# Patient Record
Sex: Female | Born: 1975 | Race: Black or African American | Hispanic: No | Marital: Single | Smoking: Current some day smoker
Health system: Southern US, Community
[De-identification: ages and names within clinical notes are randomized; demographics above are authoritative.]

## PROBLEM LIST (undated history)

## (undated) HISTORY — PX: WISDOM TOOTH EXTRACTION: SHX21

---

## 2002-04-09 ENCOUNTER — Emergency Department (HOSPITAL_COMMUNITY): Admission: EM | Admit: 2002-04-09 | Discharge: 2002-04-09 | Payer: Self-pay | Admitting: Emergency Medicine

## 2002-04-09 ENCOUNTER — Encounter: Payer: Self-pay | Admitting: Emergency Medicine

## 2015-08-19 ENCOUNTER — Emergency Department (HOSPITAL_COMMUNITY)
Admission: EM | Admit: 2015-08-19 | Discharge: 2015-08-19 | Disposition: A | Discharge status: Home / Self Care | Payer: Self-pay | Attending: Emergency Medicine | Admitting: Emergency Medicine

## 2015-08-19 ENCOUNTER — Encounter (HOSPITAL_COMMUNITY): Payer: Self-pay | Admitting: Emergency Medicine

## 2015-08-19 DIAGNOSIS — L03115 Cellulitis of right lower limb: Secondary | ICD-10-CM | POA: Insufficient documentation

## 2015-08-19 DIAGNOSIS — F17200 Nicotine dependence, unspecified, uncomplicated: Secondary | ICD-10-CM | POA: Insufficient documentation

## 2015-08-19 MED ORDER — SULFAMETHOXAZOLE-TRIMETHOPRIM 800-160 MG PO TABS: 1.0000 | ORAL_TABLET | Freq: Two times a day (BID) | ORAL | Status: AC

## 2015-08-19 MED ORDER — MUPIROCIN CALCIUM 2 % NA OINT: TOPICAL_OINTMENT | NASAL | Status: AC

## 2015-08-19 MED ORDER — CLINDAMYCIN HCL 150 MG PO CAPS
450.0000 mg | ORAL_CAPSULE | Freq: Three times a day (TID) | ORAL | Status: DC
Start: 2015-08-19 — End: 2015-08-19

## 2015-08-19 MED ORDER — CEPHALEXIN 500 MG PO CAPS: 500.0000 mg | ORAL_CAPSULE | Freq: Four times a day (QID) | ORAL | Status: AC

## 2015-08-19 MED ORDER — ONDANSETRON 8 MG PO TBDP: ORAL_TABLET | ORAL | Status: AC

## 2015-08-19 NOTE — Discharge Instructions (Signed)
1. Medications: clindamycin, bactroban, usual home medications 2. Treatment: rest, drink plenty of fluids, use warm compresses, flush with warm water several times per day and wash with warm soap and water 3. Follow Up: Please followup with your primary doctor in 2-3 days for discussion of your diagnoses and further evaluation after today's visit; if you do not have a primary care doctor use the resource guide provided to find one; Please return to the ER for fevers, chills, nausea, vomiting or other signs of infection    Cellulitis Cellulitis is an infection of the skin and the tissue beneath it. The infected area is usually red and tender. Cellulitis occurs most often in the arms and lower legs.  CAUSES  Cellulitis is caused by bacteria that enter the skin through cracks or cuts in the skin. The most common types of bacteria that cause cellulitis are staphylococci and streptococci. SIGNS AND SYMPTOMS   Redness and warmth.  Swelling.  Tenderness or pain.  Fever. DIAGNOSIS  Your health care provider can usually determine what is wrong based on a physical exam. Blood tests may also be done. TREATMENT  Treatment usually involves taking an antibiotic medicine. HOME CARE INSTRUCTIONS   Take your antibiotic medicine as directed by your health care provider. Finish the antibiotic even if you start to feel better.  Keep the infected arm or leg elevated to reduce swelling.  Apply a warm cloth to the affected area up to 4 times per day to relieve pain.  Take medicines only as directed by your health care provider.  Keep all follow-up visits as directed by your health care provider. SEEK MEDICAL CARE IF:   You notice red streaks coming from the infected area.  Your red area gets larger or turns dark in color.  Your bone or joint underneath the infected area becomes painful after the skin has healed.  Your infection returns in the same area or another area.  You notice a swollen bump  in the infected area.  You develop new symptoms.  You have a fever. SEEK IMMEDIATE MEDICAL CARE IF:   You feel very sleepy.  You develop vomiting or diarrhea.  You have a general ill feeling (malaise) with muscle aches and pains.   This information is not intended to replace advice given to you by your health care provider. Make sure you discuss any questions you have with your health care provider.   Document Released: 05/08/2005 Document Revised: 04/19/2015 Document Reviewed: 10/14/2011 Elsevier Interactive Patient Education 2016 [employer].    Emergency Department Resource Guide 1) Find a Doctor and Pay Out of Pocket Although you won't have to find out who is covered by your insurance plan, it is a good idea to ask around and get recommendations. You will then need to call the office and see if the doctor you have chosen will accept you as a new patient and what types of options they offer for patients who are self-pay. Some doctors offer discounts or will set up payment plans for their patients who do not have insurance, but you will need to ask so you aren't surprised when you get to your appointment.  2) Contact Your Local Health Department Not all health departments have doctors that can see patients for sick visits, but many do, so it is worth a call to see if yours does. If you don't know where your local health department is, you can check in your phone book. The CDC also has a tool to  help you locate your state's health department, and many state websites also have listings of all of their local health departments.  3) Find a Walk-in Clinic If your illness is not likely to be very severe or complicated, you may want to try a walk in clinic. These are popping up all over the country in pharmacies, drugstores, and shopping centers. They're usually staffed by nurse practitioners or physician assistants that have been trained to treat common illnesses and complaints. They're  usually fairly quick and inexpensive. However, if you have serious medical issues or chronic medical problems, these are probably not your best option.  No Primary Care Doctor: - Call Health Connect at  [phone] - they can help you locate a primary care doctor that  accepts your insurance, provides certain services, etc. - Physician Referral Service- [phone]  Chronic Pain Problems: Organization         Address  Phone   Notes  [facility]  [phone] Patients need to be referred by their primary care doctor.   Medication Assistance: Organization         Address  Phone   Notes  [facility] Medication [facility] [address] [place], [place] [address] [phone] --Must be a resident of [facility] -- Must have NO insurance coverage whatsoever (no Medicaid/ Medicare, etc.) -- The pt. MUST have a primary care doctor that directs their care regularly and follows them in the community   [facility]  [phone]   [employer]  [phone]    Agencies that provide inexpensive medical care: Organization         Address  Phone   Notes  [facility]  [phone]   [facility]    [phone]   [facility] [address] [place], [place] [address] [phone]   [facility] [address] [place] [phone]   Planned Parenthood    [phone]   [facility]    [phone]   [facility]  [address], [place] Phone:  [phone], Fax:  [phone] Hours of Operation:  9 am - 6 pm, M-F.  Also accepts Medicaid/Medicare and self-pay.  [facility]  [address], [place] Phone: [phone], Fax: [phone]. Hours of Operation:  8:30 am - 5:30 pm, M-F.  Also accepts Medicaid and self-pay.  [facility] [place] [address], [place] Phone:  [phone]   [facility] [address] Natasha Bence [place], [place] [phone], Ext. [phone] Mondays & Thursdays: 7-9 AM.  First 15 patients are seen on a first come, first serve basis.    Medicaid-accepting [facility] Providers:  Organization         Address  Phone   Notes  [facility] [address],  [phone] Also accepts self-pay patients.  [facility] [address] Laurell Josephs [place], [place]  [phone]   [facility] [address], [place] [phone]   [facility] [address], [place] [phone]   Renaye Rakers [address], [place]   [phone] Only accepts [place] patients after they have their name applied to their card.   Self-Pay (no insurance) in [facility]:  Organization  Address  Phone   Notes  Sickle Cell Patients, [facility] [address], [place] [phone]) [phone]   [facility] [address], [place] [phone]   [facility]  [address],  [phone]   [facility]/Dr. Osei-Bonsu  [address] [place] [address], [place] or [phone], [place] [phone]) [phone] Phone number for both [place] and [place] locations is the same.  [facility] [address], [place] [phone]   [facility] [address] [place] [address], [place] or [address] [phone]) [phone]   [facility] [address], [place] [phone], phone; [phone], fax Sees patients 1st and 3rd Saturday of every month.  Must not qualify for public or private insurance (i.e. Medicaid, Medicare, [place] Health Choice, Veterans' Benefits)  Household income should be no more than 200% of the poverty level The clinic cannot treat  you if you are pregnant or think you are pregnant  Sexually transmitted diseases are not treated at the clinic.    Dental Care: Organization         Address  Phone  Notes  [facility] [address], [place] [phone] Accepts children up to age 40 who are enrolled in [place]icaid or [place] Health Choice; pregnant women with a Medicaid card; and children who have applied for Medicaid or [place] Health Choice, but were declined, whose parents can pay a reduced fee at time of service.  [facility] [place]  [address], [place] [phone] Accepts children up to age 40 who are enrolled in [place]icaid or [place] Health Choice; pregnant women with a Medicaid card; and children who have applied for Medicaid or [place] Health Choice, but were declined, whose parents can pay a reduced fee at time of service.  [facility]  [address], [place] [phone]) [phone] Patients are seen by appointment only. Walk-ins are not accepted. [facility] will see patients 40 years of age and older. Monday - Tuesday (8am-5pm) Most Wednesdays (8:30-5pm) $30 per visit, cash only  [facility]  [address], [facility] [phone]) [phone] Patients are seen by appointment only. Walk-ins are not accepted. [facility] will see patients 40 years of age and older. One Wednesday Evening (Monthly: Volunteer Based).  $30 per visit, cash only  [employer] [facility]  [phone] for adults; Children under age 324, call Graduate Pediatric Dentistry at [phone]. Children aged 894-14, please call [phone] to request a pediatric application.  Dental services are provided in all areas of dental care including fillings, crowns and bridges, complete and partial dentures, implants, gum treatment, root canals, and extractions. Preventive care is also provided. Treatment  is provided to both adults and children. Patients are selected via a lottery and there is often a waiting list.   [facility] [address], [place]  [phone]) [phone] [email or web address]   [facility] [address], [place], [place] [phone], Ext. [phone] Second and Fourth Thursday of each month, opens at 6:30 AM; Clinic ends at 9 AM.  Patients are seen on a first-come first-served basis, and a limited number are seen during each clinic.   [facility]  [address], [place], [place] [phone]   Eligibility Requirements You must have lived in [place], [place], or [place] counties for at least the last three months.   You cannot be eligible for state or federal sponsored National Cityhealthcare insurance, including CIGNAVeterans Administration, [place]icaid, or [employer] EntertainmentMedicare.   You generally cannot be eligible for healthcare insurance through your employer.    How to apply: Eligibility screenings are held every Tuesday and Wednesday afternoon from 1:00 pm until 4:00 pm. You do not need an appointment for the interview!  [facility] [address], [place], [place] [phone]   [facility]  [phone]   [facility]  [phone]   [facility]  [phone]    Behavioral Health Resources in the Community: Intensive Outpatient Programs Organization         Address  Phone  Notes  [facility] [address], [place], [place] [phone]   [facility] Outpatient [address], [place], [place] [phone]   [facility] [address], [place], [place]  [phone]   [facility] [address],  [place], [place] [phone] or [phone]   Substance Abuse Resources Organization         Address  Phone  Notes  [facility]  [phone]   [facility]  [phone]   [facility]  [phone]   [facility]  [phone]   Residential & Outpatient Substance Abuse Program  [phone]   Psychological Services Organization         Address  Phone  Notes  [facility]  [phone]- [phone]   [facility]  [phone]- [phone]   [facility] [address], [place] [phone] or [phone]    Mobile Crisis Teams Organization         Address  Phone  Notes  [facility], Mobile Crisis Care Unit  [phone]   [facility]  [address] [place], [place] [phone]   Doristine LocksSharon DeEsch [address] [place] [phone]    Self-Help/Support Groups Organization         Address  Phone             Notes  [facility] [place] - variety of support groups  [phone]- [phone] Call for more information  Narcotics Anonymous (NA), [facility] [address], [place]  2 meetings at this location   Statisticianesidential Treatment Programs Organization         Address  Phone  Notes  [facility] [address],    [place]  [phone]   [facility]  [address], [place] [phone], [place], [place] [phone]   [facility] [address], [place] [phone] Admissions: 8am-3pm M-F  [facility] [address]    [place], [place] [phone]   [facility] [address], [place], [place] [phone]   [facility] [address]  [place], [place] [phone]   [facility] - Intensive Outpatient [address] Laure[address] Josephs[address], [place], [place] [phone]   [facility] ([facility] [address] [place] [address]  [place], [place] [phone] or [phone]   [facility] ([facility]) [address] [place], [place] [phone] Accepts Medicaid  [facility] [address]  [place] [phone] Substance Abuse/Addiction Treatment     [facility] Resources  Organization         Address  Phone  Notes  °[facility]  [phone]) [phone]   °Julie Brannon, PhD [address] [place], [employer]   ([phone] or ([phone]) [phone]   °[facility]   [address] °[place], [place] ([phone]) [phone]   °[facility] [address], [place], [place] [phone]) [phone] Insurance/Medicaid/sponsorship through [facility]  °[facility] [address]                                    [place], [place] [phone] Therapy/tele-psych/case  °[facility] [address]  ° [place], [place] [phone]) [phone]    °Dr. Arfeen  ([phone]   °[facility] [place]  [employer] [place] [facility] 1) [address],  °2) [address], [place] °3)  [address] [place] [address], [place] [phone]) [phone] °([phone]) [phone] ° °([phone]) [phone]   °[place] Child Abuse Hotline (336) [phone] or ([phone]) [phone] (After Hours)    ° ° ° °

## 2015-08-19 NOTE — ED Provider Notes (Signed)
CSN: [id number]     Arrival date & time 08/19/15  1637 History   First MD Initiated Contact with Patient 08/19/15 1818     Chief Complaint  Patient presents with  . Abscess     (Consider location/radiation/quality/duration/timing/severity/associated sxs/prior Treatment) Patient is a 40 y.o. female presenting with abscess. The history is provided by the patient and medical records. No language interpreter was used.  Abscess Associated symptoms: no fatigue, no fever, no headaches, no nausea and no vomiting      Barbara Cohen is a 40 y.o. female  with no known medical hx presents to the Emergency Department complaining of gradual, persistent, progressively worsening lesion to the right anterior tibia onset 2 weeks ago. Pt reports she has used alcohol swabs and a white potato with initial resolution, but persistence of other lesions.  She now has several lesions on the right leg.  Pt has also been using peroxide on the lesions as well.  Pt reports similar lesion on her boyfriend.  She is most concerned about a now fluctuant lesion on the right anterior leg. No known aggravating or alleviating factors.  Pt denies fever, chills, headache, neck pain, chest pain, SOB, abd pain, N/V/D, weakness, dizziness, syncope, dysuria.  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  She is not a diabetic or immunocompromised.     History reviewed. No pertinent past medical history. History reviewed. No pertinent past surgical history. No family history on file. Social History  Substance Use Topics  . Smoking status: Current Every Day Smoker  . Smokeless tobacco: None  . Alcohol Use: No   OB History    No data available     Review of Systems  Constitutional: Negative for fever, diaphoresis, appetite change, fatigue and unexpected weight change.  HENT: Negative for mouth sores.   Eyes: Negative for visual disturbance.  Respiratory: Negative for cough, chest tightness, shortness of breath and wheezing.    Cardiovascular: Negative for chest pain.  Gastrointestinal: Negative for nausea, vomiting, abdominal pain, diarrhea and constipation.  Endocrine: Negative for polydipsia, polyphagia and polyuria.  Genitourinary: Negative for dysuria, urgency, frequency and hematuria.  Musculoskeletal: Negative for back pain and neck stiffness.  Skin: Positive for color change and wound. Negative for rash.  Allergic/Immunologic: Negative for immunocompromised state.  Neurological: Negative for syncope, light-headedness and headaches.  Hematological: Does not bruise/bleed easily.  Psychiatric/Behavioral: Negative for sleep disturbance. The patient is not nervous/anxious.       Allergies  Review of patient's allergies indicates not on file.  Home Medications   Prior to Admission medications   Medication Sig Start Date End Date Taking? Authorizing Provider  cephALEXin (KEFLEX) 500 MG capsule Take 1 capsule (500 mg total) by mouth 4 (four) times daily. 08/19/15   Evie Crumpler, PA-C  mupirocin nasal ointment (BACTROBAN) 2 % Apply to open wound and abscess daily 08/19/15   Dahlia Client Charle Clear, PA-C  ondansetron (ZOFRAN ODT) 8 MG disintegrating tablet 8mg  ODT q4-6 hours prn nausea 08/19/15   Dahlia Client Davinia Riccardi, PA-C  sulfamethoxazole-trimethoprim (BACTRIM DS,SEPTRA DS) 800-160 MG tablet Take 1 tablet by mouth 2 (two) times daily. 08/19/15 08/26/15  Danelia Snodgrass, PA-C   BP 120/69 mmHg  Pulse 116  Temp(Src) 98.1 F (36.7 C) (Oral)  Resp 16  SpO2 100%  LMP 07/31/2015 Physical Exam  Constitutional: She is oriented to person, place, and time. She appears well-developed and well-nourished. No distress.  Awake, alert, nontoxic appearance  HENT:  Head: Normocephalic and atraumatic.  Mouth/Throat: Oropharynx  is clear and moist. No oropharyngeal exudate.  Eyes: Conjunctivae are normal. No scleral icterus.  Neck: Normal range of motion. Neck supple.  Cardiovascular: Normal rate, regular rhythm and  intact distal pulses.   2+ DP pulses in the right foot Capillary refill < 3 sec  Pulmonary/Chest: Effort normal and breath sounds normal. No respiratory distress. She has no wheezes.  Equal chest expansion  Abdominal: Soft. Bowel sounds are normal. She exhibits no distension and no mass. There is no tenderness. There is no rebound and no guarding.  Musculoskeletal: Normal range of motion. She exhibits no edema.  Lymphadenopathy:    She has no cervical adenopathy.  Neurological: She is alert and oriented to person, place, and time.  Speech is clear and goal oriented Moves extremities without ataxia  Skin: Skin is warm and dry. She is not diaphoretic. There is erythema.  One 3 cm round open wound to the mid shaft tibia anteriorly with granulation tissue visible at the base of the wound Large 5 cm round area of erythema and induration just proximal to the open wound without fluctuance. Several small pustules noted along the anterior lower leg on the right  Psychiatric: She has a normal mood and affect.  Nursing note and vitals reviewed.   ED Course  Procedures (including critical care time)  EMERGENCY DEPARTMENT US SOFT TISSUE INTERPRETATION "Study: Limited Ultrasound of the noted body part in comments below"  INDICATIONS: Pain and Soft tissue infection Multiple views of the body part are obtained with a multi-frequency linear probe  PERFORMED BY:  Myself  IMAGES ARCHIVED?: Yes  SIDE:Right   BODY PART: leg  FINDINGS: No abcess noted and Cellulitis present  LIMITATIONS:  Body Habitus  INTERPRETATION:  No abcess noted and Cellulitis present  COMMENT:  No fluid collection for which I&D would be beneficial   MDM   Final diagnoses:  Cellulitis of right leg without foot   Barbara Cohen presents with cellulitis of the right lower leg.  Patient health care experience makes this likely MRSA. Nonhealing of the initial wound is likely secondary to persistent peroxide usage.  Discussed wound care. Will give keflex, bactrim and Bactroban.  Patient is to follow-up with her primary care provider within 2 days for wound check.  Patient denies history of IV drug use, HIV, diabetes, cocaine abuse, vasculitis.  Strong pulses in her right foot with good capillary refill.  No other reasons for delayed wound healing.  Discussed reasons to return to the emergency department.  BP 115/68 mmHg  Pulse 73  Temp(Src) 98.2 F (36.8 C) (Oral)  Resp 18  SpO2 100%  LMP 07/31/2015   Dahlia ClientHannah Nyshaun Standage, PA-C 08/19/15 2008  Lorre NickAnthony Allen, MD 08/23/15 [phone]

## 2015-08-19 NOTE — ED Notes (Signed)
Per pt, states abscess on right shine for 2 weeks

## 2017-07-22 ENCOUNTER — Other Ambulatory Visit: Payer: Self-pay

## 2017-07-22 DIAGNOSIS — N390 Urinary tract infection, site not specified: Secondary | ICD-10-CM | POA: Insufficient documentation

## 2017-07-22 DIAGNOSIS — R1030 Lower abdominal pain, unspecified: Secondary | ICD-10-CM | POA: Diagnosis present

## 2017-07-22 DIAGNOSIS — F17210 Nicotine dependence, cigarettes, uncomplicated: Secondary | ICD-10-CM | POA: Diagnosis not present

## 2017-07-22 DIAGNOSIS — Z79899 Other long term (current) drug therapy: Secondary | ICD-10-CM | POA: Diagnosis not present

## 2017-07-23 ENCOUNTER — Encounter (HOSPITAL_COMMUNITY): Payer: Self-pay | Admitting: Emergency Medicine

## 2017-07-23 ENCOUNTER — Emergency Department (HOSPITAL_COMMUNITY)
Admission: EM | Admit: 2017-07-23 | Discharge: 2017-07-23 | Disposition: A | Discharge status: Home / Self Care | Payer: Commercial Managed Care - PPO | Attending: Emergency Medicine | Admitting: Emergency Medicine

## 2017-07-23 DIAGNOSIS — N390 Urinary tract infection, site not specified: Secondary | ICD-10-CM

## 2017-07-23 LAB — BASIC METABOLIC PANEL
Anion gap: 6 (ref 5–15)
BUN: 14 mg/dL (ref 6–20)
CALCIUM: 9.3 mg/dL (ref 8.9–10.3)
CHLORIDE: 106 mmol/L (ref 101–111)
CO2: 26 mmol/L (ref 22–32)
CREATININE: 0.72 mg/dL (ref 0.44–1.00)
GFR calc Af Amer: >60 mL/min (ref >60)
GFR calc non Af Amer: >60 mL/min (ref >60)
Glucose, Bld: 99 mg/dL (ref 65–99)
Potassium: 3.5 mmol/L (ref 3.5–5.1)
SODIUM: 138 mmol/L (ref 135–145)

## 2017-07-23 LAB — CBC
HCT: 38.4 % (ref 36.0–46.0)
Hemoglobin: 12.9 g/dL (ref 12.0–15.0)
MCH: 32.2 pg (ref 26.0–34.0)
MCHC: 33.6 g/dL (ref 30.0–36.0)
MCV: 95.8 fL (ref 78.0–100.0)
PLATELETS: 228 10*3/uL (ref 150–400)
RBC: 4.01 MIL/uL (ref 3.87–5.11)
RDW: 12.8 % (ref 11.5–15.5)
WBC: 5.1 10*3/uL (ref 4.0–10.5)

## 2017-07-23 LAB — I-STAT BETA HCG BLOOD, ED (MC, WL, AP ONLY)

## 2017-07-23 LAB — URINALYSIS, ROUTINE W REFLEX MICROSCOPIC
Bilirubin Urine: NEGATIVE
GLUCOSE, UA: NEGATIVE mg/dL
Hgb urine dipstick: NEGATIVE
KETONES UR: NEGATIVE mg/dL
Leukocytes, UA: NEGATIVE
NITRITE: POSITIVE — AB
PROTEIN: NEGATIVE mg/dL
Specific Gravity, Urine: 1.027 (ref 1.005–1.030)
pH: 5 (ref 5.0–8.0)

## 2017-07-23 MED ORDER — PHENAZOPYRIDINE HCL 200 MG PO TABS: 200.0000 mg | ORAL_TABLET | Freq: Three times a day (TID) | ORAL | 0 refills | Status: AC | PRN

## 2017-07-23 MED ORDER — NITROFURANTOIN MONOHYD MACRO 100 MG PO CAPS: 100.0000 mg | ORAL_CAPSULE | Freq: Two times a day (BID) | ORAL | 0 refills | Status: AC

## 2017-07-23 MED ORDER — PHENAZOPYRIDINE HCL 100 MG PO TABS
200.0000 mg | ORAL_TABLET | Freq: Once | ORAL | Status: AC
  Administered 2017-07-23: 200 mg via ORAL
  Filled 2017-07-23: qty 2

## 2017-07-23 MED ORDER — OXYCODONE-ACETAMINOPHEN 5-325 MG PO TABS
1.0000 | ORAL_TABLET | ORAL | Status: DC | PRN
Start: 2017-07-23 — End: 2017-07-23
  Administered 2017-07-23: 1 via ORAL
  Filled 2017-07-23: qty 1

## 2017-07-23 MED ORDER — NITROFURANTOIN MONOHYD MACRO 100 MG PO CAPS
100.0000 mg | ORAL_CAPSULE | Freq: Once | ORAL | Status: AC
  Administered 2017-07-23: 100 mg via ORAL
  Filled 2017-07-23: qty 1

## 2017-07-23 NOTE — ED Triage Notes (Signed)
Reports sudden onset of right flank pain this evening.  Denies having any urinary symptoms.  Hx of bulging disc.  Took ibuprofen 800mg  at 2300 with no relief.

## 2017-07-23 NOTE — ED Provider Notes (Signed)
[facility]   CSN: [id number] Arrival date & time: 07/22/17  2339     History   Chief Complaint Chief Complaint  Patient presents with  . Flank Pain    HPI Barbara BooksKiki Cohen is a 41 y.o. female.   Back Pain   This is a recurrent problem. The current episode started 3 to 5 hours ago. The problem has not changed since onset.The pain is associated with no known injury. The pain is present in the sacro-iliac joint. The quality of the pain is described as stabbing. The pain does not radiate. The pain is moderate. The pain is the same all the time. Associated symptoms include dysuria. Pertinent negatives include no chest pain, no fever, no numbness, no weight loss, no headaches, no abdominal pain, no abdominal swelling, no bowel incontinence, no perianal numbness, no bladder incontinence, no pelvic pain, no leg pain, no paresthesias, no paresis, no tingling and no weakness. She has tried nothing for the symptoms. The treatment provided no relief.    History reviewed. No pertinent past medical history.  There are no active problems to display for this patient.   Past Surgical History:  Procedure Laterality Date  . CESAREAN SECTION     X 2    OB History    No data available       Home Medications    Prior to Admission medications   Medication Sig Start Date End Date Taking? Authorizing Provider  cephALEXin (KEFLEX) 500 MG capsule Take 1 capsule (500 mg total) by mouth 4 (four) times daily. 08/19/15   Muthersbaugh, Dahlia ClientHannah, PA-C  mupirocin nasal ointment (BACTROBAN) 2 % Apply to open wound and abscess daily 08/19/15   Muthersbaugh, Dahlia ClientHannah, PA-C  ondansetron (ZOFRAN ODT) 8 MG disintegrating tablet 8mg  ODT q4-6 hours prn nausea 08/19/15   Muthersbaugh, Dahlia ClientHannah, PA-C    Family History No family history on file.  Social History Social History   Tobacco Use  . Smoking status: Current Every Day Smoker  . Smokeless tobacco: Never Used    Substance Use Topics  . Alcohol use: No  . Drug use: Not on file     Allergies   Bactrim [sulfamethoxazole-trimethoprim]   Review of Systems Review of Systems  Constitutional: Negative for fever and weight loss.  Cardiovascular: Negative for chest pain.  Gastrointestinal: Negative for abdominal pain, bowel incontinence and nausea.  Genitourinary: Positive for dysuria. Negative for bladder incontinence, flank pain, hematuria and pelvic pain.  Musculoskeletal: Positive for back pain.  Neurological: Negative for tingling, weakness, numbness, headaches and paresthesias.  All other systems reviewed and are negative.    Physical Exam Updated Vital Signs BP (!) 110/44 (BP Location: Right Arm)   Pulse 64   Temp 98.1 F (36.7 C) (Oral)   Resp 16   Ht 4\' 11"  (1.499 m)   Wt 66.7 kg (147 lb)   LMP 07/02/2017 (Approximate)   SpO2 100%   BMI 29.69 kg/m   Physical Exam  Constitutional: She is oriented to person, place, and time. She appears well-developed and well-nourished.  HENT:  Head: Normocephalic and atraumatic.  Mouth/Throat: No oropharyngeal exudate.  Eyes: Conjunctivae are normal. Pupils are equal, round, and reactive to light.  Neck: Normal range of motion. Neck supple.  Cardiovascular: Normal rate, regular rhythm, normal heart sounds and intact distal pulses.  Pulmonary/Chest: Effort normal and breath sounds normal. No stridor. She has no wheezes. She has no rales.  Abdominal: Soft. Bowel sounds are normal. She  exhibits no mass. There is no tenderness. There is no rebound and no guarding.  Musculoskeletal: Normal range of motion. She exhibits no edema.  Neurological: She is alert and oriented to person, place, and time. She displays normal reflexes.  Skin: Skin is warm and dry. Capillary refill takes less than 2 seconds.  Psychiatric: She has a normal mood and affect.  Nursing note and vitals reviewed.    ED Treatments / Results  Labs (all labs ordered are  listed, but only abnormal results are displayed)  Results for orders placed or performed during the hospital encounter of 07/23/17  Urinalysis, Routine w reflex microscopic- may I&O cath if menses  Result Value Ref Range   Color, Urine AMBER (A) YELLOW   APPearance HAZY (A) CLEAR   Specific Gravity, Urine 1.027 1.005 - 1.030   pH 5.0 5.0 - 8.0   Glucose, UA NEGATIVE NEGATIVE mg/dL   Hgb urine dipstick NEGATIVE NEGATIVE   Bilirubin Urine NEGATIVE NEGATIVE   Ketones, ur NEGATIVE NEGATIVE mg/dL   Protein, ur NEGATIVE NEGATIVE mg/dL   Nitrite POSITIVE (A) NEGATIVE   Leukocytes, UA NEGATIVE NEGATIVE   RBC / HPF 0-5 0 - 5 RBC/hpf   WBC, UA 0-5 0 - 5 WBC/hpf   Bacteria, UA MANY (A) NONE SEEN   Squamous Epithelial / LPF 0-5 (A) NONE SEEN   Mucus PRESENT    Ca Oxalate Crys, UA PRESENT   Basic metabolic panel  Result Value Ref Range   Sodium 138 135 - 145 mmol/L   Potassium 3.5 3.5 - 5.1 mmol/L   Chloride 106 101 - 111 mmol/L   CO2 26 22 - 32 mmol/L   Glucose, Bld 99 65 - 99 mg/dL   BUN 14 6 - 20 mg/dL   Creatinine, Ser 1.61 0.44 - 1.00 mg/dL   Calcium 9.3 8.9 - 09.6 mg/dL   GFR calc non Af Amer >60 >60 mL/min   GFR calc Af Amer >60 >60 mL/min   Anion gap 6 5 - 15  CBC  Result Value Ref Range   WBC 5.1 4.0 - 10.5 K/uL   RBC 4.01 3.87 - 5.11 MIL/uL   Hemoglobin 12.9 12.0 - 15.0 g/dL   HCT 04.5 40.9 - 81.1 %   MCV 95.8 78.0 - 100.0 fL   MCH 32.2 26.0 - 34.0 pg   MCHC 33.6 30.0 - 36.0 g/dL   RDW 91.4 78.2 - 95.6 %   Platelets 228 150 - 400 K/uL  I-Stat beta hCG blood, ED  Result Value Ref Range   I-stat hCG, quantitative <5.0 <5 mIU/mL   Comment 3           No results found.   Procedures Procedures (including critical care time)  Medications Ordered in ED Medications  oxyCODONE-acetaminophen (PERCOCET/ROXICET) 5-325 MG per tablet 1 tablet (1 tablet Oral Given 07/23/17 0015)  phenazopyridine (PYRIDIUM) tablet 200 mg (not administered)  nitrofurantoin  (macrocrystal-monohydrate) (MACROBID) capsule 100 mg (not administered)      Final Clinical Impressions(s) / ED Diagnoses   UTI: denies discharge or new partners.  States this is consistent with past UTI. Follow up with your PMD in 2 days for recheck, return for diarrhea intractable pain, inability to pass stool, rigid abdomen, rectal bleeding fevers > 101, intractable vomiting.  Strict return precautions for facial swelling change in thinking or speech, fevers, stiff neck, intractable vomiting, or diarrhea, abdominal pain, Inability to tolerate liquids or food, cough, altered mental status or any concerns. No signs of  systemic illness or infection. The patient is nontoxic-appearing on exam and vital signs are within normal limits.    I have reviewed the triage vital signs and the nursing notes. Pertinent labs &imaging results that were available during my care of the patient were reviewed by me and considered in my medical decision making (see chart for details).  After history, exam, and medical workup I feel the patient has been appropriately medically screened and is safe for discharge home. Pertinent diagnoses were discussed with the patient. Patient was given return precautions      Alahni Varone, MD 07/23/17 [id number]

## 2018-10-31 ENCOUNTER — Encounter (HOSPITAL_COMMUNITY): Payer: Self-pay | Admitting: Emergency Medicine

## 2018-10-31 ENCOUNTER — Emergency Department (HOSPITAL_COMMUNITY): Payer: Self-pay

## 2018-10-31 ENCOUNTER — Other Ambulatory Visit: Payer: Self-pay

## 2018-10-31 ENCOUNTER — Emergency Department (HOSPITAL_COMMUNITY)
Admission: EM | Admit: 2018-10-31 | Discharge: 2018-10-31 | Disposition: A | Discharge status: Home / Self Care | Payer: Self-pay | Attending: Emergency Medicine | Admitting: Emergency Medicine

## 2018-10-31 DIAGNOSIS — R69 Illness, unspecified: Secondary | ICD-10-CM

## 2018-10-31 DIAGNOSIS — F17290 Nicotine dependence, other tobacco product, uncomplicated: Secondary | ICD-10-CM | POA: Insufficient documentation

## 2018-10-31 DIAGNOSIS — R059 Cough, unspecified: Secondary | ICD-10-CM

## 2018-10-31 DIAGNOSIS — J111 Influenza due to unidentified influenza virus with other respiratory manifestations: Secondary | ICD-10-CM | POA: Insufficient documentation

## 2018-10-31 DIAGNOSIS — Z79899 Other long term (current) drug therapy: Secondary | ICD-10-CM | POA: Insufficient documentation

## 2018-10-31 DIAGNOSIS — R05 Cough: Secondary | ICD-10-CM

## 2018-10-31 MED ORDER — ONDANSETRON 4 MG PO TBDP: 4.0000 mg | ORAL_TABLET | Freq: Three times a day (TID) | ORAL | 0 refills | Status: AC | PRN

## 2018-10-31 MED ORDER — OSELTAMIVIR PHOSPHATE 75 MG PO CAPS: 75.0000 mg | ORAL_CAPSULE | Freq: Two times a day (BID) | ORAL | 0 refills | Status: AC

## 2018-10-31 NOTE — Discharge Instructions (Signed)
Continue to stay well-hydrated. Gargle warm salt water and spit it out and use chloraseptic spray as needed for sore throat. Continue to alternate between Tylenol and Ibuprofen for pain or fever. Use Mucinex/Robitussin/etc for cough suppression/expectoration of mucus. Use over the counter flonase and the netipot to help with nasal congestion. May consider over-the-counter Benadryl or other antihistamine like Claritin/Zyrtec/etc to decrease secretions and for help with your symptoms. Take tamiflu as directed which may help decrease the severity and duration of symptoms if your illness is due to the flu. TAMIFLU DOES NOT CURE THE FLU! Take on a full stomach as it can cause some nausea. Use zofran as directed as needed for nausea, take 30 minutes before taking tamiflu in order to lessen the nausea from tamiflu.   Follow up with your primary care doctor in 5-7 days for recheck of ongoing symptoms. Return to emergency department for emergent changing or worsening of symptoms.

## 2018-10-31 NOTE — ED Provider Notes (Signed)
[facility]-EMERGENCY DEPT Provider Note   CSN: [id number] Arrival date & time: 10/31/18  1036    History   Chief Complaint Chief Complaint  Patient presents with  . Cough  . Headache  . Generalized Body Aches    HPI    Barbara Cohen is a 43 y.o. otherwise healthy female who presents to the ED with complaints of flulike symptoms that began yesterday.  Someone in the home recently had similar illness, and she presents today with her 2 children who also have a similar illness.  Symptoms include moderate constant body aches, headaches, cough with yellow sputum production, chills, and rhinorrhea.  She has tried Tylenol and cold medicine without relief.  She has not taken any of this today.  No known aggravating factors.  No recent travel.  She denies any known fevers, sore throat, ear pain or drainage, CP, SOB, abd pain, N/V/D/C, hematuria, dysuria, numbness, tingling, focal weakness, or any other complaints at this time.   The history is provided by the patient and medical records. No language interpreter was used.  Cough  Associated symptoms: chills, headaches, myalgias and rhinorrhea   Associated symptoms: no chest pain, no ear pain, no fever, no shortness of breath and no sore throat   Headache  Associated symptoms: cough and myalgias   Associated symptoms: no abdominal pain, no diarrhea, no ear pain, no fever, no nausea, no numbness, no sore throat, no vomiting and no weakness     History reviewed. No pertinent past medical history.  There are no active problems to display for this patient.   Past Surgical History:  Procedure Laterality Date  . CESAREAN SECTION     X 2  . WISDOM TOOTH EXTRACTION       OB History   No obstetric history on file.      Home Medications    Prior to Admission medications   Medication Sig Start Date End Date Taking? Authorizing Provider  cephALEXin (KEFLEX) 500 MG capsule Take 1 capsule (500 mg total) by mouth 4 (four)  times daily. 08/19/15   Muthersbaugh, Dahlia Client, PA-C  mupirocin nasal ointment (BACTROBAN) 2 % Apply to open wound and abscess daily 08/19/15   Muthersbaugh, Dahlia Client, PA-C  nitrofurantoin, macrocrystal-monohydrate, (MACROBID) 100 MG capsule Take 1 capsule (100 mg total) by mouth 2 (two) times daily. X 7 days 07/23/17   Nicanor Alcon, April, MD  ondansetron [facility] ODT) 8 MG disintegrating tablet 8mg  ODT q4-6 hours prn nausea 08/19/15   Muthersbaugh, Dahlia Client, PA-C  phenazopyridine (PYRIDIUM) 200 MG tablet Take 1 tablet (200 mg total) by mouth 3 (three) times daily as needed for pain. 07/23/17   Palumbo, April, MD    Family History Family History  Problem Relation Age of Onset  . Heart failure Mother   . Diabetes Father     Social History Social History   Tobacco Use  . Smoking status: Current Some Day Smoker    Types: Cigars  . Smokeless tobacco: Never Used  Substance Use Topics  . Alcohol use: Yes    Comment: occ  . Drug use: Not on file     Allergies   Bactrim [sulfamethoxazole-trimethoprim]   Review of Systems Review of Systems  Constitutional: Positive for chills. Negative for fever.  HENT: Positive for rhinorrhea. Negative for ear discharge, ear pain and sore throat.   Respiratory: Positive for cough. Negative for shortness of breath.   Cardiovascular: Negative for chest pain.  Gastrointestinal: Negative for abdominal pain, constipation, diarrhea, nausea and  vomiting.  Genitourinary: Negative for dysuria and hematuria.  Musculoskeletal: Positive for myalgias. Negative for arthralgias.  Skin: Negative for color change.  Allergic/Immunologic: Negative for immunocompromised state.  Neurological: Positive for headaches. Negative for weakness and numbness.  Psychiatric/Behavioral: Negative for confusion.   All other systems reviewed and are negative for acute change except as noted in the HPI.    Physical Exam Updated Vital Signs BP 105/65 (BP Location: Left Arm)   Pulse 84   Temp  99.3 F (37.4 C) (Oral)   Resp 18   Wt 65.8 kg   LMP 09/27/2018 (Exact Date)   SpO2 100%   BMI 29.29 kg/m   Physical Exam Vitals signs and nursing note reviewed.  Constitutional:      General: She is not in acute distress.    Appearance: Normal appearance. She is well-developed. She is not toxic-appearing.     Comments: Afebrile, nontoxic, NAD  HENT:     Head: Normocephalic and atraumatic.     Nose: Rhinorrhea present.     Mouth/Throat:     Mouth: Mucous membranes are moist.     Pharynx: Oropharynx is clear. Uvula midline. No pharyngeal swelling, oropharyngeal exudate, posterior oropharyngeal erythema or uvula swelling.     Tonsils: No tonsillar exudate or tonsillar abscesses.     Comments: Nose with mild rhinorrhea. Oropharynx clear and moist, without uvular swelling or deviation, no trismus or drooling, no tonsillar swelling or erythema, no exudates.   Eyes:     General:        Right eye: No discharge.        Left eye: No discharge.     Conjunctiva/sclera: Conjunctivae normal.  Neck:     Musculoskeletal: Normal range of motion and neck supple.  Cardiovascular:     Rate and Rhythm: Normal rate and regular rhythm.     Pulses: Normal pulses.     Heart sounds: Normal heart sounds, S1 normal and S2 normal. No murmur. No friction rub. No gallop.   Pulmonary:     Effort: Pulmonary effort is normal. No respiratory distress.     Breath sounds: Normal breath sounds. No decreased breath sounds, wheezing, rhonchi or rales.     Comments: CTAB in all lung fields, no w/r/r, no hypoxia or increased WOB, speaking in full sentences, SpO2 100% on RA  Abdominal:     General: Bowel sounds are normal. There is no distension.     Palpations: Abdomen is soft. Abdomen is not rigid.     Tenderness: There is no abdominal tenderness. There is no right CVA tenderness, left CVA tenderness, guarding or rebound. Negative signs include Murphy's sign and McBurney's sign.  Musculoskeletal: Normal range of  motion.  Skin:    General: Skin is warm and dry.     Findings: No rash.  Neurological:     Mental Status: She is alert and oriented to person, place, and time.     Sensory: Sensation is intact. No sensory deficit.     Motor: Motor function is intact.  Psychiatric:        Mood and Affect: Mood and affect normal.        Behavior: Behavior normal.      ED Treatments / Results  Labs (all labs ordered are listed, but only abnormal results are displayed) Labs Reviewed - No data to display  EKG None  Radiology Dg Chest 2 View  Result Date: 10/31/2018 CLINICAL DATA:  Dry cough, headache, sweating, chills and aches since yesterday. EXAM:  CHEST - 2 VIEW COMPARISON:  None. FINDINGS: Lungs clear. Heart size normal. No pneumothorax or pleural fluid. No bony abnormality. IMPRESSION: Negative chest Electronically Signed   By: Drusilla Kanner M.D.   On: 10/31/2018 12:14    Procedures Procedures (including critical care time)  Medications Ordered in ED Medications - No data to display   Initial Impression / Assessment and Plan / ED Course  I have reviewed the triage vital signs and the nursing notes.  Pertinent labs & imaging results that were available during my care of the patient were reviewed by me and considered in my medical decision making (see chart for details).        43 y.o. female here with flulike symptoms since yesterday, positive sick contacts at home, came in today with her 2 children who are also sick with similar symptoms.  On exam, afebrile, nontoxic-appearing, clear lung exam, mild rhinorrhea, throat clear.  No recent travel. Overall well appearing, doubt need for COVID 19 testing.  Will check chest x-ray.  Her child is getting flu tested, so we will defer flu testing on her.  Doubt need for any other labs or imaging at this time.  Will reassess shortly.  12:41 PM CXR neg. Child's flu test positive for Flu A. Will treat accordingly, zofran rx given to counter side  effects of tamiflu. Advised other OTC remedies for symptomatic relief. F/up with PCP in 1wk for recheck. I explained the diagnosis and have given explicit precautions to return to the ER including for any other new or worsening symptoms. The patient understands and accepts the medical plan as it's been dictated and I have answered their questions. Discharge instructions concerning home care and prescriptions have been given. The patient is STABLE and is discharged to home in good condition.    Final Clinical Impressions(s) / ED Diagnoses   Final diagnoses:  Influenza-like illness  Cough    ED Discharge Orders         Ordered    oseltamivir (TAMIFLU) 75 MG capsule  Every 12 hours     10/31/18 1241    ondansetron (ZOFRAN ODT) 4 MG disintegrating tablet  Every 8 hours PRN     10/31/18 [address] [place], [place] 10/31/18 1254    Vanetta Mulders, MD 11/04/18 2053

## 2018-10-31 NOTE — ED Triage Notes (Signed)
Pt reports dry cough, headache and generalized muscle aches since yesterday. Tx with tylenol 1 Gm x 2 dosages yesterday. Denies NVD. Denies shortness of breathg

## 2020-08-18 IMAGING — CR CHEST - 2 VIEW
2 series · 2 of 2 positions shown · non-contrast
Comparison: None.

CLINICAL DATA: Dry cough, headache, sweating, chills and aches
since yesterday.

EXAM:
CHEST - 2 VIEW

[w chest pa]
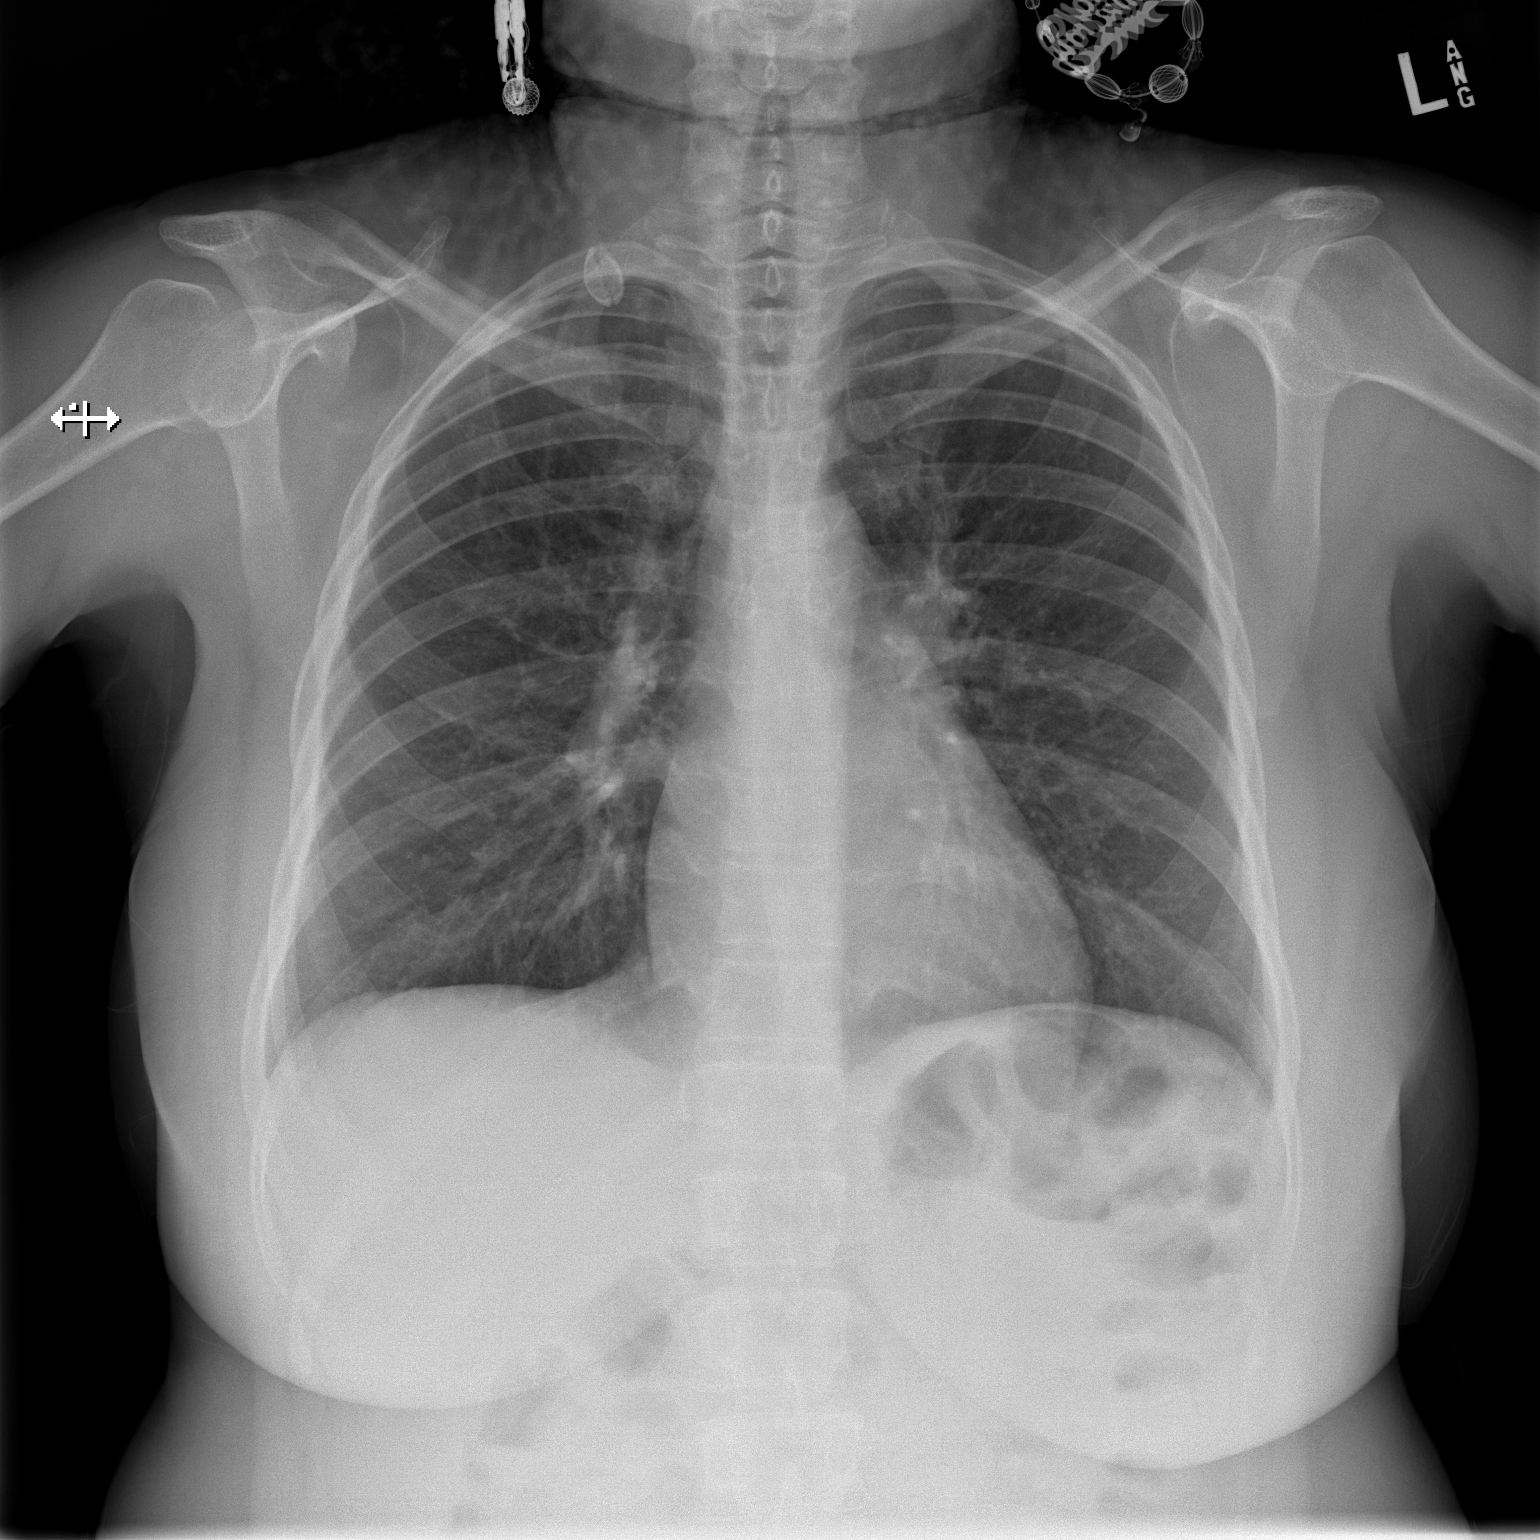

[w chest lat]
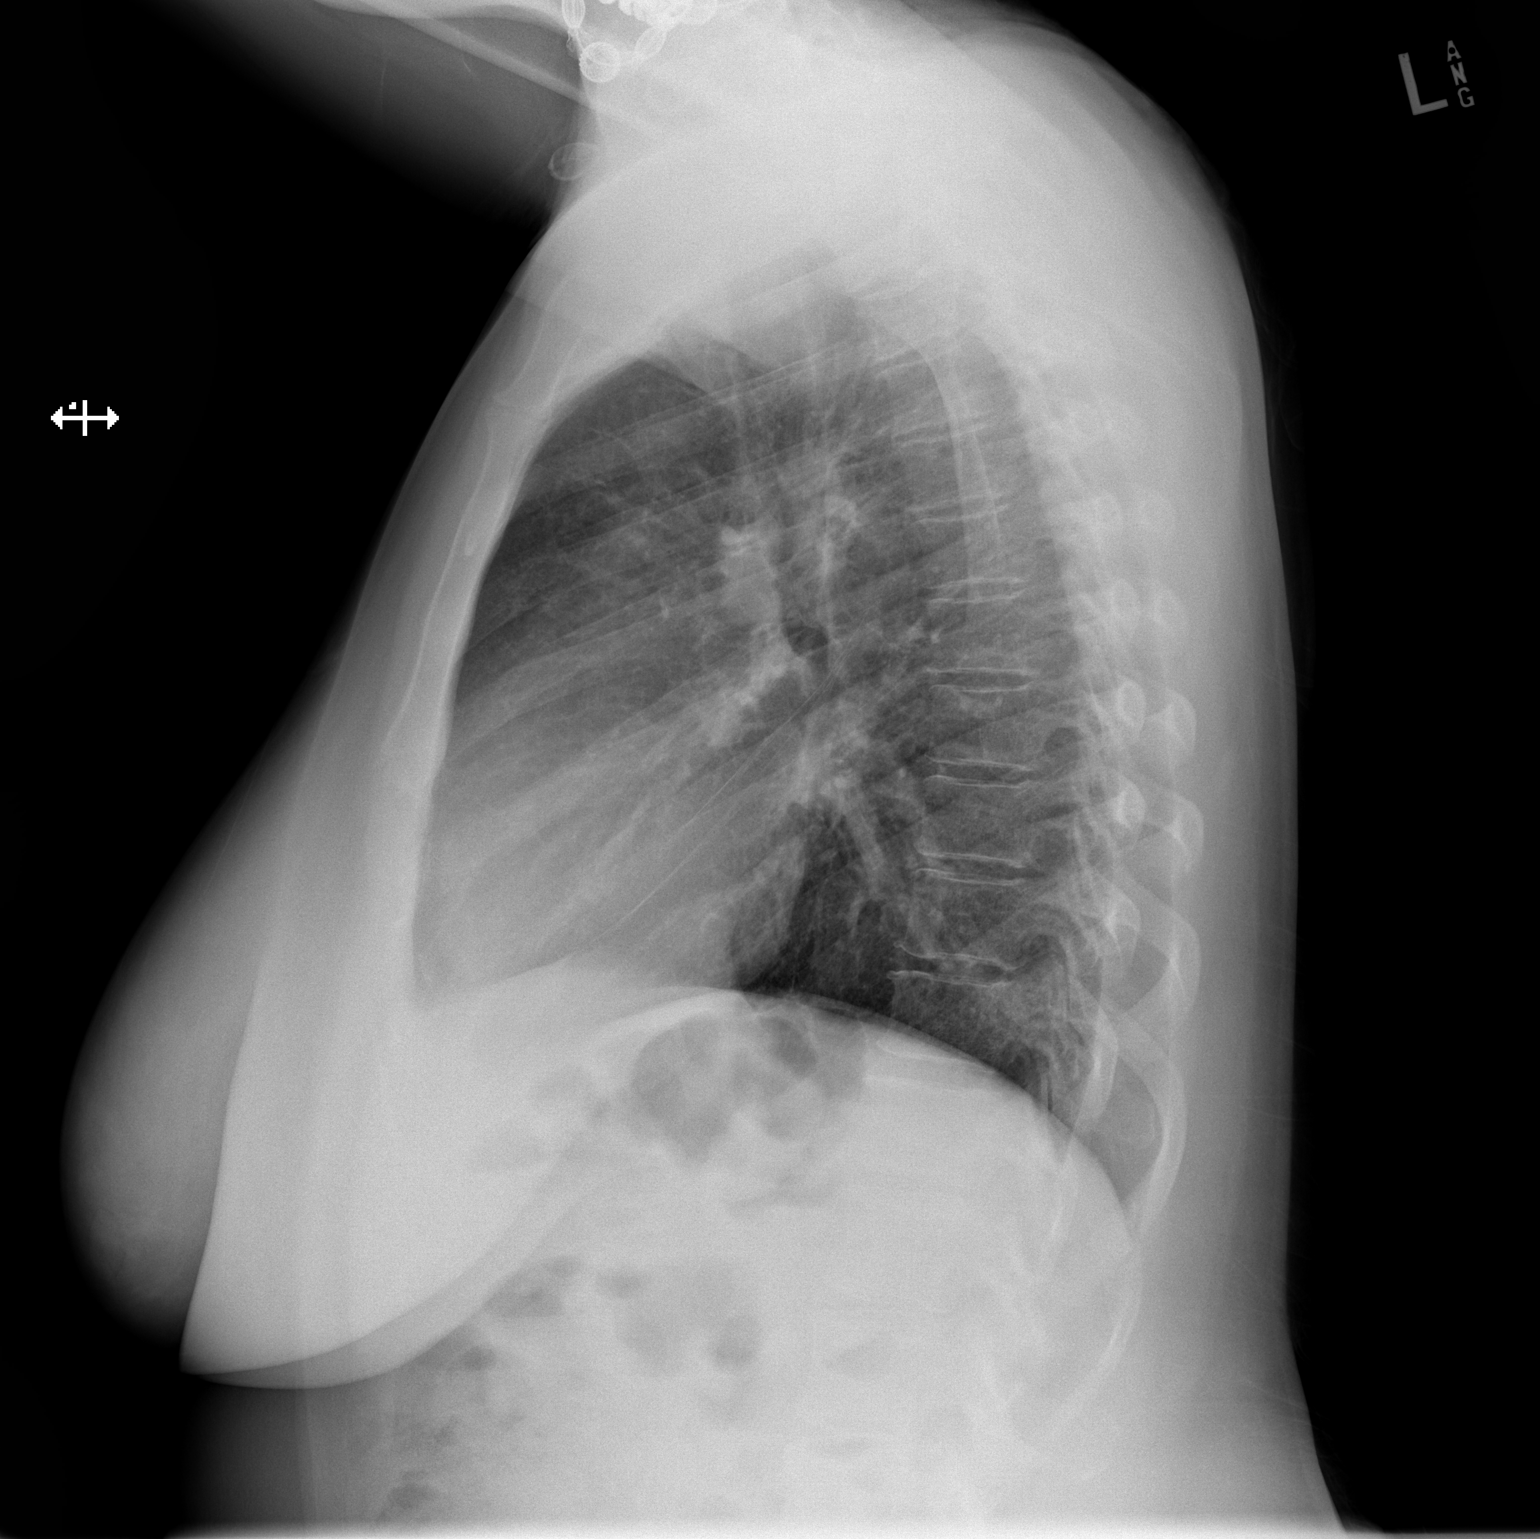

[2 of 2 positions shown; findings below may reference images not displayed]

FINDINGS: Lungs clear. Heart size normal. No pneumothorax or pleural fluid. No
bony abnormality.
IMPRESSION: Negative chest
